# Patient Record
Sex: Male | Born: 1970 | Race: White | Hispanic: No | Marital: Married | Smoking: Never smoker
Health system: Southern US, Community
[De-identification: ages and names within clinical notes are randomized; demographics above are authoritative.]

## PROBLEM LIST (undated history)

## (undated) DIAGNOSIS — N3281 Overactive bladder: Secondary | ICD-10-CM

## (undated) DIAGNOSIS — F988 Other specified behavioral and emotional disorders with onset usually occurring in childhood and adolescence: Secondary | ICD-10-CM

## (undated) DIAGNOSIS — K589 Irritable bowel syndrome without diarrhea: Secondary | ICD-10-CM

## (undated) DIAGNOSIS — T7840XA Allergy, unspecified, initial encounter: Secondary | ICD-10-CM

## (undated) DIAGNOSIS — K649 Unspecified hemorrhoids: Secondary | ICD-10-CM

## (undated) HISTORY — PX: KIDNEY STONE SURGERY: SHX686

## (undated) HISTORY — DX: Allergy, unspecified, initial encounter: T78.40XA

## (undated) HISTORY — PX: WISDOM TOOTH EXTRACTION: SHX21

## (undated) HISTORY — DX: Irritable bowel syndrome, unspecified: K58.9

## (undated) HISTORY — DX: Other specified behavioral and emotional disorders with onset usually occurring in childhood and adolescence: F98.8

## (undated) HISTORY — DX: Unspecified hemorrhoids: K64.9

## (undated) HISTORY — DX: Overactive bladder: N32.81

---

## 2011-08-26 ENCOUNTER — Emergency Department (HOSPITAL_BASED_OUTPATIENT_CLINIC_OR_DEPARTMENT_OTHER): Payer: Managed Care, Other (non HMO)

## 2011-08-26 ENCOUNTER — Encounter (HOSPITAL_BASED_OUTPATIENT_CLINIC_OR_DEPARTMENT_OTHER): Payer: Self-pay | Admitting: *Deleted

## 2011-08-26 ENCOUNTER — Emergency Department (HOSPITAL_BASED_OUTPATIENT_CLINIC_OR_DEPARTMENT_OTHER)
Admission: EM | Admit: 2011-08-26 | Discharge: 2011-08-26 | Disposition: A | Discharge status: Home / Self Care | Payer: Managed Care, Other (non HMO) | Attending: Emergency Medicine | Admitting: Emergency Medicine

## 2011-08-26 DIAGNOSIS — S93609A Unspecified sprain of unspecified foot, initial encounter: Secondary | ICD-10-CM | POA: Insufficient documentation

## 2011-08-26 DIAGNOSIS — X500XXA Overexertion from strenuous movement or load, initial encounter: Secondary | ICD-10-CM | POA: Insufficient documentation

## 2011-08-26 DIAGNOSIS — S93602A Unspecified sprain of left foot, initial encounter: Secondary | ICD-10-CM

## 2011-08-26 DIAGNOSIS — Z880 Allergy status to penicillin: Secondary | ICD-10-CM | POA: Insufficient documentation

## 2011-08-26 DIAGNOSIS — Y9289 Other specified places as the place of occurrence of the external cause: Secondary | ICD-10-CM | POA: Insufficient documentation

## 2011-08-26 MED ORDER — NAPROXEN 500 MG PO TABS: 500.0000 mg | ORAL_TABLET | Freq: Two times a day (BID) | ORAL | Status: DC

## 2011-08-26 NOTE — ED Notes (Signed)
Pt. Reports he has pain in the L foot to the outer side.  NOted edema and bruising.

## 2011-08-26 NOTE — ED Provider Notes (Signed)
History     CSN: [id number]  Arrival date & time 08/26/11  2157   First MD Initiated Contact with Patient 08/26/11 2318      Chief Complaint  Patient presents with  . Foot Injury    per Pt. about 1930 he turned the L foot and now has bruising and edema on the outer side of the L foot.    (Consider location/radiation/quality/duration/timing/severity/associated sxs/prior treatment) HPI Comments: 41 year old male who presents after inverting his left foot while stepping on a curb, felt a pop and then gradual onset of swelling which has been persistent, stable, not associated with difficulty ambulating. No head injury neck pain or other musculoskeletal complaints.  Patient is a 41 y.o. male presenting with foot injury. The history is provided by the patient and a relative.  Foot Injury  Pertinent negatives include no numbness.    History reviewed. No pertinent past medical history.  History reviewed. No pertinent past surgical history.  No family history on file.  History  Substance Use Topics  . Smoking status: Not on file  . Smokeless tobacco: Not on file  . Alcohol Use: Not on file      Review of Systems  HENT: Negative for neck pain.   Gastrointestinal: Negative for nausea and vomiting.  Musculoskeletal: Positive for joint swelling ( Left foot). Negative for back pain.  Neurological: Negative for weakness and numbness.    Allergies  Amoxicillin  Home Medications   Current Outpatient Rx  Name Route Sig Dispense Refill  . TYLENOL COLD PO Oral Take 2 tablets by mouth daily as needed. For cold symptoms.    . SOLIFENACIN SUCCINATE 5 MG PO TABS Oral Take 5 mg by mouth daily.    Marland Kitchen NAPROXEN 500 MG PO TABS Oral Take 1 tablet (500 mg total) by mouth 2 (two) times daily with a meal. 30 tablet 0    BP 136/89  Pulse 75  Temp 98.4 F (36.9 C) (Oral)  Resp 18  Ht 6' (1.829 m)  Wt 150 lb (68.04 kg)  BMI 20.34 kg/m2  SpO2 99%  Physical Exam  Nursing note and vitals  reviewed. Constitutional: He appears well-developed and well-nourished. No distress.  HENT:  Head: Normocephalic and atraumatic.  Eyes: Conjunctivae are normal. No scleral icterus.  Cardiovascular: Normal rate, regular rhythm and intact distal pulses.   Pulmonary/Chest: Effort normal and breath sounds normal.  Musculoskeletal: He exhibits tenderness ( Mild tenderness to palpation and associated with mild bruising and swelling to the left dorsal lateral foot, no pain or tenderness over the ankles, base of the fifth metatarsal or midfoot). He exhibits no edema.  Neurological: He is alert. Coordination normal.       Mild antalgic gait secondary to foot pain  Skin: Skin is warm and dry. He is not diaphoretic.       Mild bruising to dorsal lateral left foot    ED Course  Procedures (including critical care time)  Labs Reviewed - No data to display Dg Foot Complete Left  08/26/2011  *RADIOLOGY REPORT*  Clinical Data: Left foot pain after injury.  LEFT FOOT - COMPLETE 3+ VIEW  Comparison: None.  Findings: The left foot appears intact. No evidence of acute fracture or subluxation.  No focal bone lesions.  Bone matrix and cortex appear intact.  No abnormal radiopaque densities in the soft tissues.  IMPRESSION: No acute bony abnormalities.  Original Report Authenticated By: Marlon Pel, M.D.     1. Sprain of left foot  MDM  Rice therapy, x-rays reviewed showing no signs of acute fracture, patient is well-appearing, ambulatory, has expressed his understanding, Naprosyn for home        Vida Roller, MD 08/26/11 2333

## 2011-08-26 NOTE — ED Notes (Signed)
MD at bedside. 

## 2012-04-14 ENCOUNTER — Telehealth: Payer: Self-pay | Admitting: *Deleted

## 2012-04-14 NOTE — Telephone Encounter (Signed)
PT WANTS TO GET A REF. TO [facility]STR[facility] PHYS.  STOMACH ISSUES AND WANTED TO GET LOOKED INTO FURTHER.

## 2012-04-18 NOTE — Telephone Encounter (Signed)
Pt is scheduled on 04/24/12 to discuss his current GI problems. PG

## 2012-04-26 ENCOUNTER — Encounter: Payer: Self-pay | Admitting: Family Medicine

## 2012-04-26 ENCOUNTER — Ambulatory Visit (INDEPENDENT_AMBULATORY_CARE_PROVIDER_SITE_OTHER): Payer: BC Managed Care – PPO | Admitting: Family Medicine

## 2012-04-26 VITALS — BP 109/75 | HR 76 | Wt 157.0 lb

## 2012-04-26 DIAGNOSIS — K589 Irritable bowel syndrome without diarrhea: Secondary | ICD-10-CM

## 2012-04-26 MED ORDER — COLESEVELAM HCL 3.75 G PO PACK: 1.0000 | PACK | Freq: Every day | ORAL | Status: DC

## 2012-04-26 MED ORDER — DICYCLOMINE HCL 20 MG PO TABS: 20.0000 mg | ORAL_TABLET | Freq: Three times a day (TID) | ORAL | Status: DC | PRN

## 2012-04-26 MED ORDER — ESOMEPRAZOLE MAGNESIUM 40 MG PO CPDR: 40.0000 mg | DELAYED_RELEASE_CAPSULE | Freq: Every day | ORAL | Status: DC

## 2012-04-26 NOTE — Progress Notes (Signed)
Subjective:     Patient ID: Gary Becker, male   DOB: Dec 09, 1970, 42 y.o.   MRN: [id number]  HPI Gary Becker is here today to discuss the irritable bowel problem he has had for many years.  He describes his symptoms as being mild to moderate at times and he feels that he is worsening.  He takes Pepto Bismol and Tums which give him some relief.     Review of Systems  Gastrointestinal: Positive for abdominal pain, diarrhea and abdominal distention.       Objective:   Physical Exam  Constitutional: He appears well-nourished. No distress.  Abdominal: Soft. Bowel sounds are normal. He exhibits no distension and no mass. There is tenderness. There is no rebound and no guarding.       Assessment:     Irritable Bowel Syndrome     Plan:     A)  Eliminate Dairy vs Gluten   B)  Stomach Acid - Increase Tums to 3 per day; Pepcid (20-40 mg)  vs Zantac (150-300 mg) , Nexium (Prilosec/Prevacid)   C)  Bowel Composition - Probiotics (Lemon Ginger Tea, Align)   D)  Bowel Motility - Bentyl prescription was given.   E)  Diarrhea/Cholesterol - Welchol (1 package daily) prescription was given.   **

## 2012-04-26 NOTE — Patient Instructions (Addendum)
1)  IBS  A)  Eliminate Dairy vs Gluten   B)  Stomach Acid - Increase Tums to 3 per day; Pepcid (20-40 mg)  vs Zantac (150-300 mg) , Nexium (Prilosec/Prevacid)   C)  Bowel Composition - Probiotics (Lemon Ginger Tea, Align)   D)  Bowel Motility - Bentyl  E)  Diarrhea/Cholesterol - Welchol (1 package daily)    Diet and Irritable Bowel Syndrome  No cure has been found for irritable bowel syndrome (IBS). Many options are available to treat the symptoms. Your caregiver will give you the best treatments available for your symptoms. He or she will also encourage you to manage stress and to make changes to your diet. You need to work with your caregiver and Registered Dietician to find the best combination of medicine, diet, counseling, and support to control your symptoms. The following are some diet suggestions. FOODS THAT MAKE IBS WORSE  Fatty foods, such as [place] fries.  Milk products, such as cheese or ice cream.  Chocolate.  Alcohol.  Caffeine (found in coffee and some sodas).  Carbonated drinks, such as soda. If certain foods cause symptoms, you should eat less of them or stop eating them. FOOD JOURNAL   Keep a journal of the foods that seem to cause distress. Write down:  What you are eating during the day and when.  What problems you are having after eating.  When the symptoms occur in relation to your meals.  What foods always make you feel badly.  Take your notes with you to your caregiver to see if you should stop eating certain foods. FOODS THAT MAKE IBS BETTER Fiber reduces IBS symptoms, especially constipation, because it makes stools soft, bulky, and easier to pass. Fiber is found in bran, bread, cereal, beans, fruit, and vegetables. Examples of foods with fiber include:  Apples.  Peaches.  Pears.  Berries.  Figs.  Broccoli, raw.  Cabbage.  Carrots.  Raw peas.  Kidney beans.  Lima beans.  Whole-grain bread.  Whole-grain cereal. Add foods  with fiber to your diet a little at a time. This will let your body get used to them. Too much fiber at once might cause gas and swelling of your abdomen. This can trigger symptoms in a person with IBS. Caregivers usually recommend a diet with enough fiber to produce soft, painless bowel movements. High fiber diets may cause gas and bloating. However, these symptoms often go away within a few weeks, as your body adjusts. In many cases, dietary fiber may lessen IBS symptoms, particularly constipation. However, it may not help pain or diarrhea. High fiber diets keep the colon mildly enlarged (distended) with the added fiber. This may help prevent spasms in the colon. Some forms of fiber also keep water in the stool, thereby preventing hard stools that are difficult to pass.  Besides telling you to eat more foods with fiber, your caregiver may also tell you to get more fiber by taking a fiber pill or drinking water mixed with a special high fiber powder. An example of this is a natural fiber laxative containing psyllium seed.  TIPS  Large meals can cause cramping and diarrhea in people with IBS. If this happens to you, try eating 4 or 5 small meals a day, or try eating less at each of your usual 3 meals. It may also help if your meals are low in fat and high in carbohydrates. Examples of carbohydrates are pasta, rice, whole-grain breads and cereals, fruits, and vegetables.  If  dairy products cause your symptoms to flare up, you can try eating less of those foods. You might be able to handle yogurt better than other dairy products, because it contains bacteria that helps with digestion. Dairy products are an important source of calcium and other nutrients. If you need to avoid dairy products, be sure to talk with a Registered Dietitian about getting these nutrients through other food sources.  Drink enough water and fluids to keep your urine clear or pale yellow. This is important, especially if you have  diarrhea. FOR MORE INFORMATION  International Foundation for Functional Gastrointestinal Disorders: www.iffgd.org  National Digestive Diseases Information Clearinghouse: digestive.StageSync.si Document Released: 03/27/2003 Document Revised: 03/29/2011 Document Reviewed: 12/12/2006 [facility] Patient Information 2013 [place], [place]. Gluten-Free Diet Gluten is a protein found in many grains. Gluten is present in wheat, rye, and barley. Gluten from wheat, rye, and barley protein interferes with the absorption of food in people with gluten sensitivity. It may also cause intestinal injury when eaten by individuals with gluten sensitivity.  A sample piece (biopsy) of the small intestine is usually required for a positive diagnosis of gluten sensitivity. Dietary treatment consists of eliminating foods and food ingredients from wheat, rye, and barley. When these are taken out of the diet completely, most people regain function of the small intestine. Strict compliance is important even during symptom-free periods. People with gluten sensitivity need to be on a gluten-free diet for a lifetime. During the first stages of treatment, some people will also need to restrict dairy products that contain lactose, which is a naturally occurring sugar. Lactose is difficult to absorb when the small intestines are damaged (lactose intolerance).  WHO NEEDS THIS DIET Some people who have certain diseases need to be on a gluten-free diet. These diseases include:  Celiac disease.  Nontropical sprue.  Gluten-sensitive enteropathy.  Dermatitis herpetiformis. SPECIAL NOTES  Read all labels because gluten may have been added as an incidental ingredient. Words to check for on the label include: flour, starch, durum flour, graham flour, phosphated flour, self-rising flour, semolina, farina, modified food starch, cereal, thickening, fillers, emulsifiers, any kind of malt flavoring, and hydrolyzed vegetable protein. A  registered dietician can help you identify possible harmful ingredients in the foods you normally eat.  If you are not sure whether an ingredient contains gluten, check with the manufacturer. Note that some manufacturers may change ingredients without notice. Always read labels.  Since flour and cereal products are often used in the preparation of foods, it is important to be aware of the methods of preparation used, as well as the foods themselves. This is especially true when you are dining out. Starches  Allowed: Only those prepared from arrowroot, corn, potato, rice, and bean flours. Rice wafers(*), pure cornmeal tortillas, popcorn, some crackers, and chips(*). Hot cereals made from cornmeal. Ask your dietician which specific hot and cold cereals are allowed. White or sweet potatoes, yams, hominy, rice or wild rice, and special gluten-free pasta. Some oriental rice noodles or bean noodles.  Avoid: All wheat and rye cereals, wheat germ, barley, bran, graham, malt, bulgur, and millet(-). NOTE: Avoid cereals containing malt as a flavoring, such as rice cereal. Regular noodles, spaghetti, macaroni, and most packaged rice mixes(*). All others containing wheat, rye, or barley. Vegetables  Allowed: All plain, fresh, frozen, or canned vegetables.  Avoid: Creamed vegetables(*) and vegetables canned in sauces(*). Any prepared with wheat, rye, or barley. Fruit  Allowed: All fresh, frozen, canned, or dried fruits. Fruit juices.  Avoid: Thickened  or prepared fruits and some pie fillings(*). Meat and Meat Substitutes  Allowed: Meat, fish, poultry, or eggs prepared without added wheat, rye, or barley. Luncheon meat(*), frankfurters(*), and pure meat. All aged cheese and processed cheese products(*). Cottage cheese(+) and cream cheese(+). Dried beans, dried peas, and lentils.  Avoid: Any meat or meat alternate containing wheat, rye, barley, or gluten stabilizers. Bread-containing products, such as Swiss  steak, croquettes, and meatloaf. Tuna canned in vegetable broth(*); [place] with HVP injected as part of the basting; any cheese product containing oat gum as an ingredient. Milk  Allowed: Milk. Yogurt made with allowed ingredients(*).  Avoid: Commercial chocolate milk which may have cereal added(*). Malted milk. Soups and Combination Foods  Allowed: Homemade broth and soups made with allowed ingredients; some canned or frozen soups are allowed(*). Combination or prepared foods that do not contain gluten(*). Read labels.  Avoid: All soups containing wheat, rye, or barley flour. Bouillon and bouillon cubes that contain hydrolyzed vegetable protein (HVP). Combination or prepared foods that contain gluten(*). Desserts  Allowed:  Custard, some pudding mixes(*), homemade puddings from cornstarch, rice, and tapioca. Gelatin desserts, ices, and sherbet(*). Cake, cookies, and other desserts prepared with allowed flours. Some commercial ice creams(*). Ask your dietician about specific brands of dessert that are allowed.  Avoid: Cakes, cookies, doughnuts, and pastries that are prepared with wheat, rye, or barley flour. Some commercial ice creams(*), ice cream flavors which contain cookies, crumbs, or cheesecake(*). Ice cream cones. All commercially prepared mixes for cakes, cookies, and other desserts(*). Bread pudding and other puddings thickened with flour. Sweets  Allowed: Sugar, honey, syrup(*), molasses, jelly, jam, plain hard candy, marshmallows, gumdrops, homemade candies free from wheat, rye, or barley. Coconut.  Avoid: Commercial candies containing wheat, rye, or barley(*). Certain buttercrunch toffees are dusted with wheat flour. Ask your dietician about specific brands that are not allowed. Chocolate-coated nuts, which are often rolled in flour. Fats and Oils  Allowed: Butter, margarine, vegetable oil, sour cream(+), whipping cream, shortening, lard, cream, mayonnaise(*). Some commercial salad  dressings(*). Peanut butter.  Avoid: Some commercial salad dressings(*). Beverages  Allowed: Coffee (regular or decaffeinated), tea, herbal tea (read label to be sure that no wheat flour has been added). Carbonated beverages and some root beers(*). Wine, sake, and distilled spirits, such as gin, vodka, and whiskey.  Avoid:  Certain cereal beverages. Ask your dietician about specific brands that are not allowed. Beer (unless gluten-free), ale, malted milk, and some root beers, wine, and sake. Condiments/ Miscellaneous  Allowed: Salt, pepper, herbs, spices, extracts, and food colorings. Monosodium glutamate (MSG). Cider, rice, and wine vinegar. Baking soda and baking powder. Certain soy sauces. Ask your dietician about specific brands that are allowed. Nuts, coconut, chocolate, and pure cocoa powder.  Avoid: Some curry powder(*), some dry seasoning mixes(*), some gravy extracts(*), some meat sauces(*), some catsup(*), some prepared mustard(*), horseradish(*), some soy sauce(*), chip dips(*), and some chewing gum(*). Yeast extract (contains barley). Caramel color (may contain malt). Ask your dietician about specific brands of condiments to avoid. Flour and Thickening Agents  Allowed: Arrowroot starch (A); Corn bran (B); Corn flour (B,C,D); Corn germ (B); Cornmeal (B,C,D); Corn starch (A); Potato flour (B,C,E); Potato starch flour (B,C,E); Rice bran (B); Rice flours: Plain, brown (B,C,D,E), and Sweet (A,B,C,F). Rice polish (B,C,G); Soy flour (B,C,G); Tapioca starch (A). The flour and thickening agents described above are good for: (A) Good thickening agent (B) Good when combined with other flours (C) Best combined with milk and eggs in baked products (  D) Best in grainy-textured products (E) Produces drier product than other flours (F) Produces moister product than other flours (G) Adds distinct flavor to product. Use in moderation. (*) Check labels and investigate any questionable ingredients.   (-) Additional research is needed before this product can be recommended. (+) Check vegetable gum used. SAMPLE MEAL PLAN Breakfast   Orange juice.  Banana.  Rice or corn cereal.  Toast (gluten-free bread).  Heart-healthy tub margarine.  Jam.  Milk.  Coffee or tea. Lunch  Chicken salad sandwich (with gluten-free bread and mayonnaise).  Sliced tomatoes.  Heart-healthy tub margarine.  Apple.  Milk.  Coffee or tea. Dinner  [place].  Baked potato.  Broccoli.  Lettuce salad with gluten-free dressing.  Gluten-free bread.  Custard.  Heart-healthy margarine.  Coffee or tea. These meal plans are provided as samples. Your daily meal plans will vary. Document Released: 01/04/2005 Document Revised: 07/06/2011 Document Reviewed: 02/14/2011 [facility] Patient Information 2013 [place], [place].

## 2012-04-27 ENCOUNTER — Encounter: Payer: Self-pay | Admitting: Family Medicine

## 2012-04-27 DIAGNOSIS — K589 Irritable bowel syndrome without diarrhea: Secondary | ICD-10-CM | POA: Insufficient documentation

## 2013-07-26 ENCOUNTER — Ambulatory Visit (INDEPENDENT_AMBULATORY_CARE_PROVIDER_SITE_OTHER): Payer: BC Managed Care – PPO | Admitting: Family Medicine

## 2013-07-26 ENCOUNTER — Encounter: Payer: Self-pay | Admitting: Family Medicine

## 2013-07-26 VITALS — BP 128/78 | HR 61 | Resp 16 | Wt 154.0 lb

## 2013-07-26 DIAGNOSIS — K649 Unspecified hemorrhoids: Secondary | ICD-10-CM

## 2013-07-26 DIAGNOSIS — K648 Other hemorrhoids: Secondary | ICD-10-CM

## 2013-07-26 DIAGNOSIS — K589 Irritable bowel syndrome without diarrhea: Secondary | ICD-10-CM

## 2013-07-26 DIAGNOSIS — K625 Hemorrhage of anus and rectum: Secondary | ICD-10-CM

## 2013-07-26 MED ORDER — HYDROCORTISONE 2.5 % RE CREA: 1.0000 "application " | TOPICAL_CREAM | Freq: Two times a day (BID) | RECTAL | Status: DC

## 2013-07-26 NOTE — Patient Instructions (Signed)
1)  Hemorrhoids - OTC (Preparation H - suppositories/Anusol  Suppositories) vs RX (Proctosol) 1-2 times per day for itching.  Try to not sit for long periods of time.  We will refer you to a gastroenterologist to discuss your hemorrhoids and IBS and whether or not you need a colonoscopy.      Anal Pruritus Anal pruritus is an itching of the anus, which is often due to increased moisture of the skin around the anus. Moisture may be due to sweating or a small amount of remaining stool. The itching and scratching can cause further skin damage.  CAUSES   Poor hygiene.  Excessive moisture from sweating or residual stool in the anal area.  Perfumed soaps and sprays and colored toilet paper.  Chemicals in the foods you eat.  Dietary factors such as caffeine, beer, milk products, chocolate, nuts, citrus fruits, tomatoes, spicy seasonings, jalapeno peppers, and salsa.  Hemorrhoids, infections, and other anal diseases.  Excessive washing.  Overuse of laxatives.  Skin disorders (psoriasis, eczema, or seborrhea). HOME CARE INSTRUCTIONS   Practice good hygiene.  Clean the anal area gently with wet toilet paper, baby wipes, or a wet washcloth after every bowel movement and at bedtime. Avoid using soaps on the anal area. Dry the area thoroughly. Pat the area dry with toilet paper or a towel.  Do not scrub the anal area with anything, even toilet paper.  Try not to scratch the itchy area. Scratching produces more damage, which makes the itching worse.  Take sitz baths in warm water for 15 to 20 minutes, 2 to 3 times a day. Pat the area dry with a soft cloth after each bath.  Zinc oxide ointment or a moisture barrier cream can be applied several times daily to protect the skin.  Only take medicines as directed by your caregiver.  Talk to your caregiver about fiber supplements. These are helpful in normalizing the stool if you have frequent loose stools.  Wear cotton underwear and loose  clothing.  Do not use irritants such as bubble baths, scented toilet paper, or genital deodorants. SEEK MEDICAL CARE IF:   Itching does not improve in several days or gets worse.  You have a fever.  There are problems with increased pain, swelling, or redness. MAKE SURE YOU:   Understand these instructions.  Will watch your condition.  Will get help right away if you are not doing well or get worse. Document Released: 07/06/2010 Document Revised: 03/29/2011 Document Reviewed: 07/06/2010 [employer] Patient Information 2015 [place], [place]. This information is not intended to replace advice given to you by your health care provider. Make sure you discuss any questions you have with your health care provider.  Hemorrhoids Hemorrhoids are swollen veins around the rectum or anus. There are two types of hemorrhoids:   Internal hemorrhoids. These occur in the veins just inside the rectum. They may poke through to the outside and become irritated and painful.  External hemorrhoids. These occur in the veins outside the anus and can be felt as a painful swelling or hard lump near the anus. CAUSES  Pregnancy.   Obesity.   Constipation or diarrhea.   Straining to have a bowel movement.   Sitting for long periods on the toilet.  Heavy lifting or other activity that caused you to strain.  Anal intercourse. SYMPTOMS   Pain.   Anal itching or irritation.   Rectal bleeding.   Fecal leakage.   Anal swelling.   One or more lumps around  the anus.  DIAGNOSIS  Your caregiver may be able to diagnose hemorrhoids by visual examination. Other examinations or tests that may be performed include:   Examination of the rectal area with a gloved hand (digital rectal exam).   Examination of anal canal using a small tube (scope).   A Rathert test if you have lost a significant amount of Kiehn.  A test to look inside the colon (sigmoidoscopy or colonoscopy). TREATMENT Most  hemorrhoids can be treated at home. However, if symptoms do not seem to be getting better or if you have a lot of rectal bleeding, your caregiver may perform a procedure to help make the hemorrhoids get smaller or remove them completely. Possible treatments include:   Placing a rubber band at the base of the hemorrhoid to cut off the circulation (rubber band ligation).   Injecting a chemical to shrink the hemorrhoid (sclerotherapy).   Using a tool to burn the hemorrhoid (infrared light therapy).   Surgically removing the hemorrhoid (hemorrhoidectomy).   Stapling the hemorrhoid to block [place] flow to the tissue (hemorrhoid stapling).  HOME CARE INSTRUCTIONS   Eat foods with fiber, such as whole grains, beans, nuts, fruits, and vegetables. Ask your doctor about taking products with added fiber in them (fibersupplements).  Increase fluid intake. Drink enough water and fluids to keep your urine clear or pale yellow.   Exercise regularly.   Go to the bathroom when you have the urge to have a bowel movement. Do not wait.   Avoid straining to have bowel movements.   Keep the anal area dry and clean. Use wet toilet paper or moist towelettes after a bowel movement.   Medicated creams and suppositories may be used or applied as directed.   Only take over-the-counter or prescription medicines as directed by your caregiver.   Take warm sitz baths for 15-20 minutes, 3-4 times a day to ease pain and discomfort.   Place ice packs on the hemorrhoids if they are tender and swollen. Using ice packs between sitz baths may be helpful.   Put ice in a plastic bag.   Place a towel between your skin and the bag.   Leave the ice on for 15-20 minutes, 3-4 times a day.   Do not use a donut-shaped pillow or sit on the toilet for long periods. This increases Geier pooling and pain.  SEEK MEDICAL CARE IF:  You have increasing pain and swelling that is not controlled by treatment or  medicine.  You have uncontrolled bleeding.  You have difficulty or you are unable to have a bowel movement.  You have pain or inflammation outside the area of the hemorrhoids. MAKE SURE YOU:  Understand these instructions.  Will watch your condition.  Will get help right away if you are not doing well or get worse. Document Released: 01/02/2000 Document Revised: 12/22/2011 Document Reviewed: 11/09/2011 [facility] Patient Information 2015 [place], [place]. This information is not intended to replace advice given to you by your health care provider. Make sure you discuss any questions you have with your health care provider.

## 2013-07-26 NOTE — Progress Notes (Signed)
   Subjective:    Patient ID: Gary Becker, male    DOB: [birth year], 43 y.o.   MRN: [id number]  HPI  Gary Becker is here today complaining of hemorrhoids. He started having trouble with them about 3 weeks ago. He has not tried any OTC medication for his symptoms.  He would just like to have this checked out.     Review of Systems  Constitutional: Negative for activity change, appetite change and fatigue.  Cardiovascular: Negative for chest pain, palpitations and leg swelling.  Gastrointestinal: Positive for Basto in stool and rectal pain.       Hemorrhoids   All other systems reviewed and are negative.     Past Medical History  Diagnosis Date  . Allergy   . ADD (attention deficit disorder)   . IBS (irritable bowel syndrome)   . 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 ALS Mother   . Stroke Paternal Grandfather   . Alzheimer's disease Paternal Grandfather      Current Outpatient Prescriptions on File Prior to Visit  Medication Sig Dispense Refill  . solifenacin (VESICARE) 5 MG tablet Take 5 mg by mouth daily.       No current facility-administered medications on file prior to visit.     Allergies  Allergen Reactions  . Amoxicillin Rash     Immunization History  Administered Date(s) Administered  . Tdap 09/09/2006        Objective:   Physical Exam  Constitutional: He appears well-nourished. No distress.  Abdominal: Soft. Bowel sounds are normal. He exhibits no distension  and no mass. There is no rebound and no guarding.  Genitourinary: Rectal exam shows external hemorrhoid and tenderness. Rectal exam shows no mass and anal tone normal.         Assessment & Plan:    Gary Becker was seen today for hemorrhoids.  Diagnoses and associated orders for this visit:  Other hemorrhoids Comments: He was given medications and instructions for hemorrhoids.   - hydrocortisone (PROCTOSOL HC) 2.5 % rectal cream; Place 1 application rectally 2 (two) times daily. - Ambulatory referral to Gastroenterology  Rectal bleeding Comments: Referral was made to [facility].   - Ambulatory referral to Gastroenterology  IBS (irritable bowel syndrome) Comments: Gary Becker has had this problem for years.  He has been using essential oils recently and his symptoms seem to have improved.   - Ambulatory referral to Gastroenterology

## 2013-08-31 ENCOUNTER — Encounter: Payer: Self-pay | Admitting: Internal Medicine

## 2013-09-06 ENCOUNTER — Encounter: Payer: Self-pay | Admitting: Family Medicine

## 2013-09-06 DIAGNOSIS — K625 Hemorrhage of anus and rectum: Secondary | ICD-10-CM | POA: Insufficient documentation

## 2013-09-06 DIAGNOSIS — K589 Irritable bowel syndrome without diarrhea: Secondary | ICD-10-CM | POA: Insufficient documentation

## 2013-09-06 DIAGNOSIS — K648 Other hemorrhoids: Secondary | ICD-10-CM | POA: Insufficient documentation

## 2013-10-10 ENCOUNTER — Encounter: Payer: Self-pay | Admitting: Internal Medicine

## 2013-11-01 ENCOUNTER — Ambulatory Visit: Payer: BC Managed Care – PPO | Admitting: Internal Medicine

## 2013-11-06 IMAGING — CR DG FOOT COMPLETE 3+V*L*
3 series · 3 of 3 positions shown · non-contrast
Comparison: None.

CLINICAL DATA: Left foot pain after injury.

LEFT FOOT - COMPLETE 3+ VIEW

[t foot ap left]
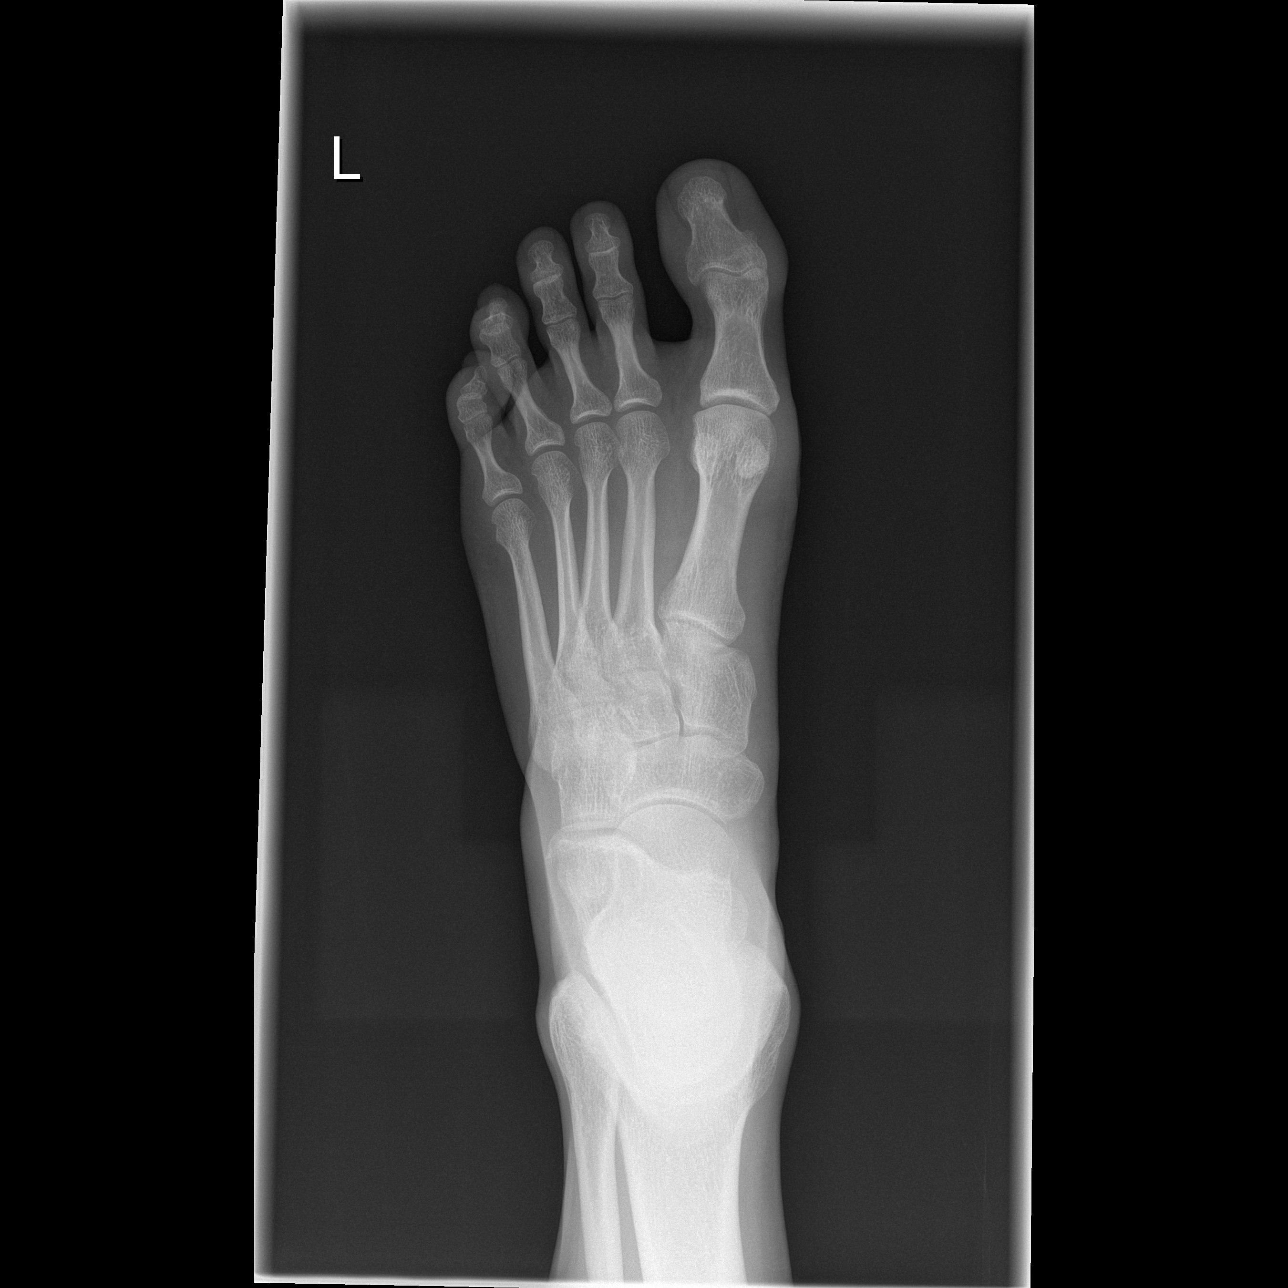

[t foot oblique left]
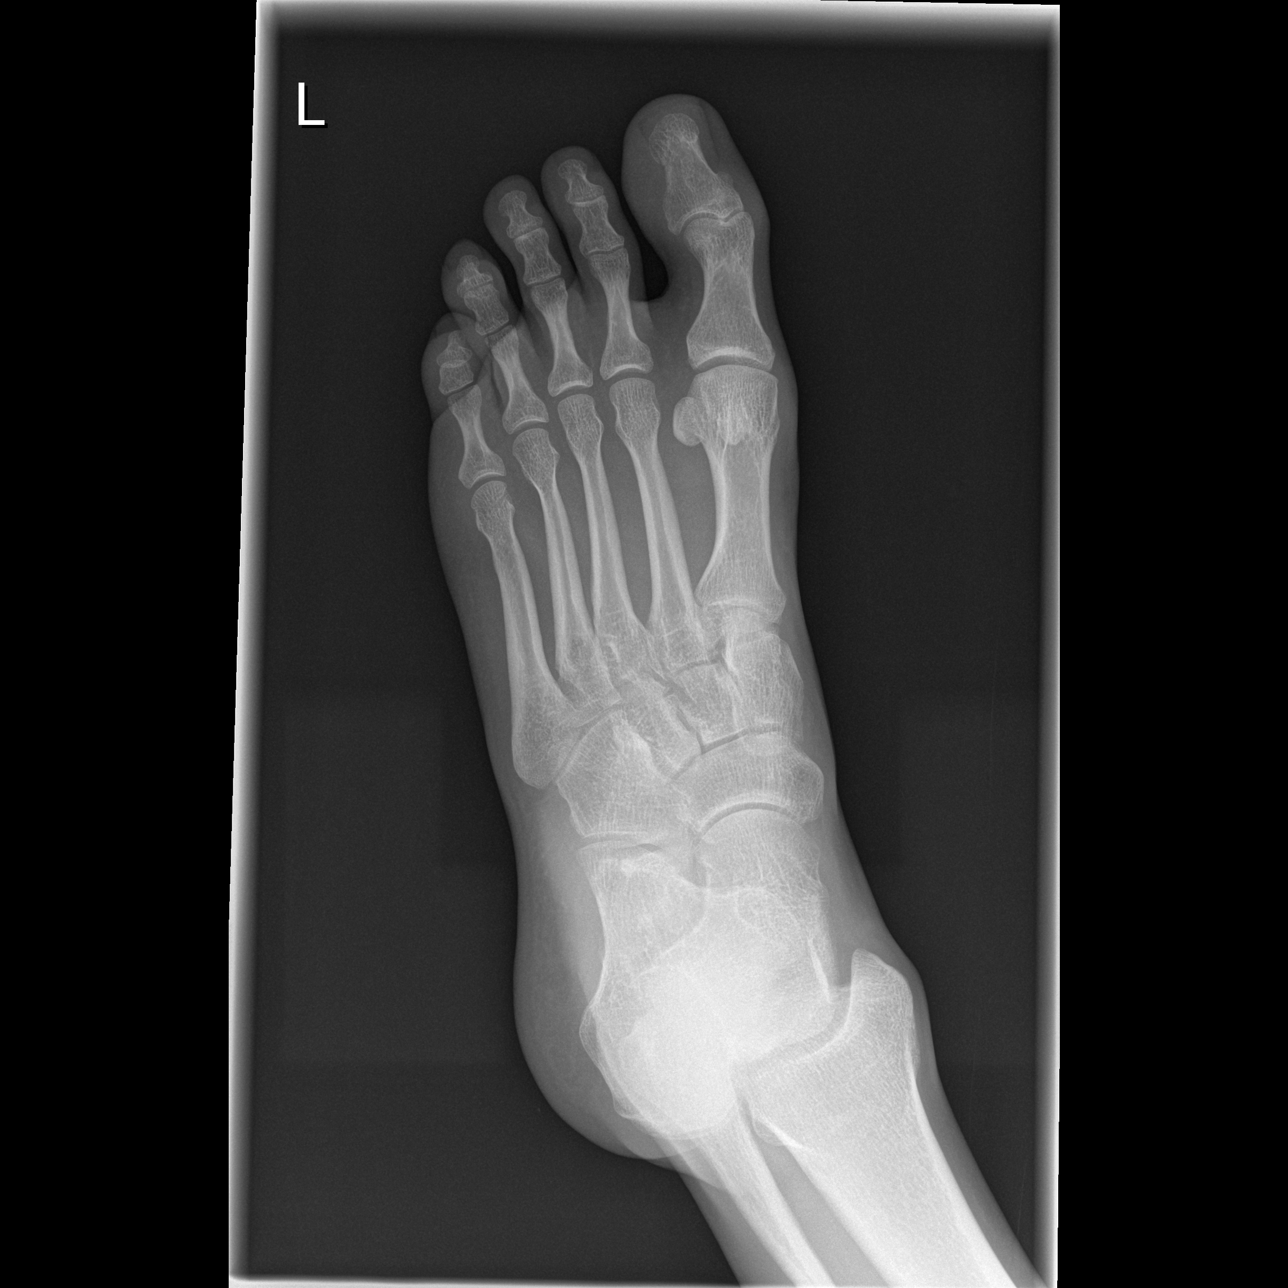

[t foot lat left]
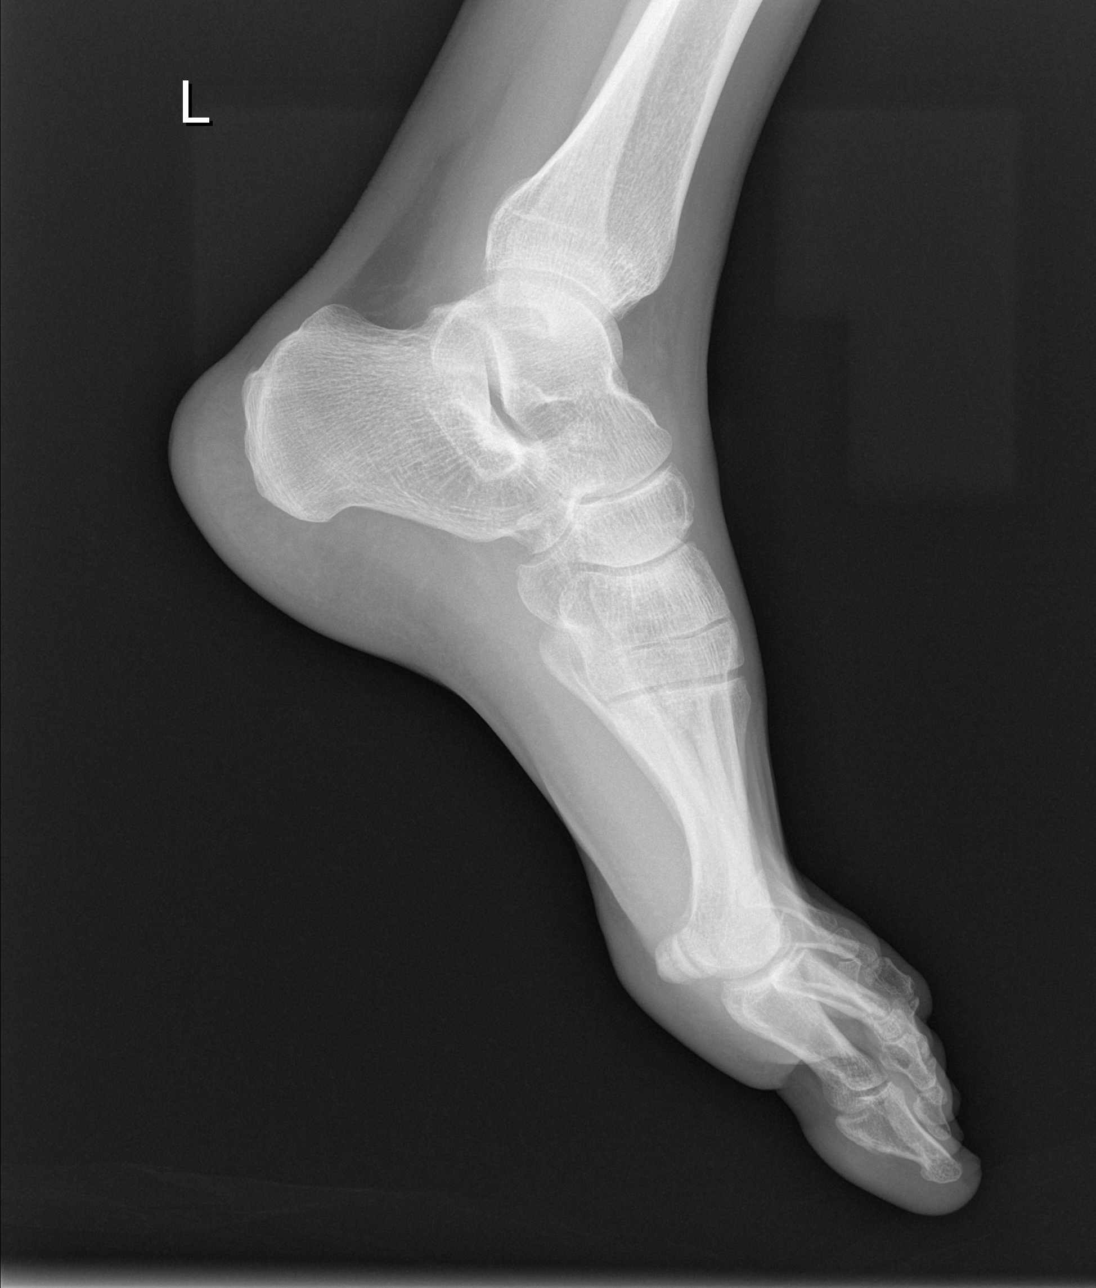

[3 of 3 positions shown; findings below may reference images not displayed]

FINDINGS: The left foot appears intact. No evidence of acute
fracture or subluxation.  No focal bone lesions.  Bone matrix and
cortex appear intact.  No abnormal radiopaque densities in the soft
tissues.
IMPRESSION: No acute bony abnormalities.

## 2013-12-11 ENCOUNTER — Ambulatory Visit (INDEPENDENT_AMBULATORY_CARE_PROVIDER_SITE_OTHER): Payer: BC Managed Care – PPO | Admitting: Internal Medicine

## 2013-12-11 ENCOUNTER — Encounter: Payer: Self-pay | Admitting: Internal Medicine

## 2013-12-11 ENCOUNTER — Other Ambulatory Visit (INDEPENDENT_AMBULATORY_CARE_PROVIDER_SITE_OTHER): Payer: BC Managed Care – PPO

## 2013-12-11 VITALS — BP 110/78 | HR 60 | Ht 72.0 in | Wt 153.4 lb

## 2013-12-11 DIAGNOSIS — R197 Diarrhea, unspecified: Secondary | ICD-10-CM

## 2013-12-11 DIAGNOSIS — R1084 Generalized abdominal pain: Secondary | ICD-10-CM

## 2013-12-11 LAB — IGA: IgA: 158 mg/dL (ref 68–378)

## 2013-12-11 MED ORDER — MOVIPREP 100 G PO SOLR: 1.0000 | Freq: Once | ORAL | Status: DC

## 2013-12-11 NOTE — Progress Notes (Signed)
HISTORY OF PRESENT ILLNESS:  Gary Becker is a 43 y.o. male presents today upon referral from his primary care physician regarding long-standing GI complaints and recently identified hemorrhoid. Patient reports a greater than 20 year history of problems with multiple bowel movements and urgency. Occasional incontinence. Symptoms are exacerbated by meals and stress. Most prominent in the morning and during the week. No nocturnal symptoms. He has used Pepto-Bismol previously. No antidiarrheals. No significant abdominal discomfort or weight loss. Some bloating. Generally 3-4 bowel movements per day, maybe more. He also treats himself with T via and a digestive supplement "digest zen". Will occasionally experience a constipated type bowel. Rare bleeding with multiple bowel movements and occasional area of itching but otherwise no difficulties with hemorrhoids. No longer using cream prescribed. He was prescribed Vesicare for urinary leakage. He feels this is also helped his bowels. His sister apparently has celiac disease. No family history of inflammatory bowel disease.  REVIEW OF SYSTEMS:  All non-GI ROS negative upon extensive complete review  Past Medical History  Diagnosis Date  . Allergy   . ADD (attention deficit disorder)   . IBS (irritable bowel syndrome)   . Overactive bladder   . Hemorrhoids     Past Surgical History  Procedure Laterality Date  . Wisdom tooth extraction    . Kidney stone surgery      Social History Gary Becker  reports that he has never smoked. He has never used smokeless tobacco. He reports that he does not drink alcohol or use illicit drugs.  family history includes ALS in his mother; Alzheimer's disease in his paternal grandfather; Stroke in his paternal grandfather.  Allergies  Allergen Reactions  . Amoxicillin Rash       PHYSICAL EXAMINATION: Vital signs: BP 110/78 mmHg  Pulse 60  Ht 6' (1.829 m)  Wt 153 lb 6.4 oz (69.582 kg)  BMI 20.80 kg/m2   Constitutional: generally well-appearing, no acute distress Psychiatric: alert and oriented x3, cooperative Eyes: extraocular movements intact, anicteric, conjunctiva pink Mouth: oral pharynx moist, no lesions Neck: supple no lymphadenopathy Cardiovascular: heart regular rate and rhythm, no murmur Lungs: clear to auscultation bilaterally Abdomen: soft, nontender, nondistended, no obvious ascites, no peritoneal signs, normal bowel sounds, no organomegaly Rectal: Deferred until colonoscopy (recent rectal exam with external hemorrhoid and tenderness noted from PCP July 2015) Extremities: no lower extremity edema bilaterally Skin: no lesions on visible extremities Neuro: No focal deficits.   ASSESSMENT:  #1. 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 Rule out organic causes. #2. Hemorrhoid. Minimal symptoms  PLAN:  #1. Tissue transglutaminase antibody IgA and serum IgA level to screen for celiac disease #2. Schedule colonoscopy with biopsies to evaluate chronic GI complaints and rule out microscopic colitis or other entities.The nature of the procedure, as well as the risks, benefits, and alternatives were carefully and thoroughly reviewed with the patient. Ample time for discussion and questions allowed. The patient understood, was satisfied, and agreed to proceed. #3. I had a long discussion with him today regarding different diagnostic possibilities. As well we discussed irritable bowel with some detail. All questions answered to his satisfaction at this point.

## 2013-12-11 NOTE — Patient Instructions (Signed)
Your physician has requested that you go to the basement for the following lab work before leaving today:  TTG, IGA  You have been scheduled for a colonoscopy. Please follow written instructions given to you at your visit today.  Please pick up your prep kit at the pharmacy within the next 1-3 days. If you use inhalers (even only as needed), please bring them with you on the day of your procedure. Your physician has requested that you go to www.startemmi.com and enter the access code given to you at your visit today. This web site gives a general overview about your procedure. However, you should still follow specific instructions given to you by our office regarding your preparation for the procedure.   

## 2013-12-12 LAB — TISSUE TRANSGLUTAMINASE, IGA: Tissue Transglutaminase Ab, IgA: <1 U/mL (ref <4)

## 2013-12-27 ENCOUNTER — Encounter: Payer: Self-pay | Admitting: Internal Medicine

## 2014-02-14 ENCOUNTER — Encounter: Payer: BC Managed Care – PPO | Admitting: Internal Medicine

## 2014-02-18 ENCOUNTER — Ambulatory Visit (AMBULATORY_SURGERY_CENTER): Payer: BLUE CROSS/BLUE SHIELD | Admitting: Internal Medicine

## 2014-02-18 ENCOUNTER — Encounter: Payer: Self-pay | Admitting: Internal Medicine

## 2014-02-18 VITALS — BP 115/80 | HR 61 | Temp 96.2°F | Resp 22 | Ht 72.0 in | Wt 153.0 lb

## 2014-02-18 DIAGNOSIS — R197 Diarrhea, unspecified: Secondary | ICD-10-CM

## 2014-02-18 DIAGNOSIS — K589 Irritable bowel syndrome without diarrhea: Secondary | ICD-10-CM

## 2014-02-18 DIAGNOSIS — K625 Hemorrhage of anus and rectum: Secondary | ICD-10-CM

## 2014-02-18 MED ORDER — RIFAXIMIN 550 MG PO TABS: 550.0000 mg | ORAL_TABLET | Freq: Two times a day (BID) | ORAL | Status: DC

## 2014-02-18 MED ORDER — SODIUM CHLORIDE 0.9 % IV SOLN: 500.0000 mL | INTRAVENOUS | Status: DC

## 2014-02-18 NOTE — Patient Instructions (Addendum)

## 2014-02-18 NOTE — Progress Notes (Signed)
Called to room to assist during endoscopic procedure.  Patient ID and intended procedure confirmed with present staff. Received instructions for my participation in the procedure from the performing physician.  

## 2014-02-18 NOTE — Progress Notes (Signed)
Report to PACU, RN, vss, BBS= Clear.  

## 2014-02-18 NOTE — Op Note (Signed)
[facility] [address] [place], [id number]   COLONOSCOPY PROCEDURE REPORT  PATIENT: Gary ClarkBlood, Min  MR#: [id number] BIRTHDATE: Oct 13, 1970 , 43  yrs. old GENDER: male ENDOSCOPIST: Roxy CedarJohn N Skyleen Bentley Jr, MD REFERRED XB:JYNWGBY:Robyn Zanard, M.D. PROCEDURE DATE:  02/18/2014 PROCEDURE:   Colonoscopy with biopsies First Screening Colonoscopy - Avg.  risk and is 50 yrs.  old or older - No.  Prior Negative Screening - Now for repeat screening. N/A  History of Adenoma - Now for follow-up colonoscopy & has been > or = to 3 yrs.  N/A  Polyps Removed Today? No.  Recommend repeat exam, <10 yrs? No. ASA CLASS:   Class I INDICATIONS:chronic diarrhea.. Minor rectal bleeding MEDICATIONS: Monitored anesthesia care and Propofol 300 mg IV  DESCRIPTION OF PROCEDURE:   After the risks benefits and alternatives of the procedure were thoroughly explained, informed consent was obtained.  The digital rectal exam revealed hemorrhoids.   The LB [id number]  endoscope was introduced through the anus and advanced to the cecum, which was identified by both the appendix and ileocecal valve. No adverse events experienced.   The quality of the prep was excellent, using MoviPrep  The instrument was then slowly withdrawn as the colon was fully examined.   COLON FINDINGS: The examined terminal ileum appeared to be normal. A normal appearing cecum, ileocecal valve, and appendiceal orifice were identified.  The ascending, transverse, descending, sigmoid colon, and rectum appeared unremarkable. Random colon biopsies taken to rule out microscopic colitis.  Retroflexed views revealed internal hemorrhoids. The time to cecum=3 minutes 16 seconds. Withdrawal time=8 minutes 14 seconds.  The scope was withdrawn and the procedure completed. COMPLICATIONS: There were no immediate complications.  ENDOSCOPIC IMPRESSION: 1.   The examined terminal ileum appeared normal 2.   Normal  colonoscopy  RECOMMENDATIONS: 1.  Await biopsy results 2.  Continue current colorectal screening recommendations for "routine risk" patients with a repeat colonoscopy in 10 years. 3. Prescribed Xifaxan 550 mg; #28; one by mouth twice a day 4. Office follow-up in 6 weeks  eSigned:  Roxy CedarJohn N Vibha Ferdig Jr, MD 02/18/2014 10:52 AM   cc: The Patient and Birdena JubileeZanard, Robyn MD

## 2014-02-19 ENCOUNTER — Telehealth: Payer: Self-pay

## 2014-02-19 NOTE — Telephone Encounter (Signed)
  Follow up Call-  Call back number 02/18/2014  Post procedure Call Back phone  # [phone]  Permission to leave phone message Yes     Patient questions:  Do you have a fever, pain , or abdominal swelling? No. Pain Score  0 *  Have you tolerated food without any problems? Yes.    Have you been able to return to your normal activities? Yes.    Do you have any questions about your discharge instructions: Diet   No. Medications  No. Follow up visit  No.  Do you have questions or concerns about your Care? No.  Actions: * If pain score is 4 or above: No action needed, pain <4.

## 2014-02-21 ENCOUNTER — Encounter: Payer: Self-pay | Admitting: Internal Medicine

## 2014-04-08 ENCOUNTER — Ambulatory Visit (INDEPENDENT_AMBULATORY_CARE_PROVIDER_SITE_OTHER): Payer: BLUE CROSS/BLUE SHIELD | Admitting: Internal Medicine

## 2014-04-08 ENCOUNTER — Encounter: Payer: Self-pay | Admitting: Internal Medicine

## 2014-04-08 VITALS — BP 100/60 | HR 80 | Ht 72.0 in | Wt 155.0 lb

## 2014-04-08 DIAGNOSIS — R197 Diarrhea, unspecified: Secondary | ICD-10-CM | POA: Diagnosis not present

## 2014-04-08 DIAGNOSIS — K589 Irritable bowel syndrome without diarrhea: Secondary | ICD-10-CM | POA: Diagnosis not present

## 2014-04-08 MED ORDER — RIFAXIMIN 550 MG PO TABS: 550.0000 mg | ORAL_TABLET | Freq: Two times a day (BID) | ORAL | Status: DC

## 2014-04-08 NOTE — Patient Instructions (Signed)
We have sent the following medications to your pharmacy for you to pick up at your convenience:  xifaxan  Please follow up with Dr. Marina GoodellPerry in one year

## 2014-04-08 NOTE — Progress Notes (Signed)
HISTORY OF PRESENT ILLNESS:  Gary ClarkDavid Becker is a 44 y.o. male was initially evaluated 12/11/2013 regarding a 20 year history of bowel complaints manifested by multiple bowel movements and urgency. Symptoms exacerbated by meals and stress. Most prominent in the morning and during the week. No nocturnal symptoms. Testing for celiac disease was negative. He subsequently underwent complete colonoscopy with intubation of the terminal ileum 02/18/2014. Examination was normal. Random colon biopsies were normal. He was treated with rifaximin 550 mg twice a day 2 weeks. He presents today for follow-up. Patient states that while taking rifaximin he had normal bowel movements for the first time in years. Shortly after completion of treatment, symptoms returned. He had no medication side effect. He does request another course of medication.  REVIEW OF SYSTEMS:  All non-GI ROS negative except for urinary leakage  Past Medical History  Diagnosis Date  . Allergy   . ADD (attention deficit disorder)   . IBS (irritable bowel syndrome)   . Overactive bladder   . Hemorrhoids     Past Surgical History  Procedure Laterality Date  . Wisdom tooth extraction    . Kidney stone surgery      Social History Gary Becker  reports that he has never smoked. He has never used smokeless tobacco. He reports that he does not drink alcohol or use illicit drugs.  family history includes ALS in his mother; Alzheimer's disease in his paternal grandfather; Stroke in his paternal grandfather.  Allergies  Allergen Reactions  . Amoxicillin Rash       PHYSICAL EXAMINATION: Vital signs: BP 100/60 mmHg  Pulse 80  Ht 6' (1.829 m)  Wt 155 lb (70.308 kg)  BMI 21.02 kg/m2 General: Well-developed, well-nourished, no acute distress HEENT: Sclerae are anicteric, conjunctiva pink. Oral mucosa intact Lungs: Clear Heart: Regular Abdomen: soft, nontender, nondistended, no obvious ascites, no peritoneal signs, normal bowel sounds.  No organomegaly. Extremities: No edema Psychiatric: alert and oriented x3. Cooperative     ASSESSMENT:  #1. Diarrhea predominant IBS. Responded to rifaximin without durable response   PLAN:  #1. Repeat course of rifaximin 550 mg twice a day 3 weeks. Should this again prove helpful, then refills for on demand treatment. #2. Routine GI follow-up one year #3. Routine repeat screening colonoscopy 10 years

## 2020-03-12 DIAGNOSIS — K648 Other hemorrhoids: Secondary | ICD-10-CM | POA: Diagnosis not present

## 2020-03-12 DIAGNOSIS — K625 Hemorrhage of anus and rectum: Secondary | ICD-10-CM | POA: Diagnosis not present

## 2020-03-12 DIAGNOSIS — K6289 Other specified diseases of anus and rectum: Secondary | ICD-10-CM | POA: Diagnosis not present

## 2020-03-26 DIAGNOSIS — K648 Other hemorrhoids: Secondary | ICD-10-CM | POA: Diagnosis not present

## 2020-09-18 DIAGNOSIS — N3281 Overactive bladder: Secondary | ICD-10-CM | POA: Diagnosis not present

## 2020-09-18 DIAGNOSIS — R350 Frequency of micturition: Secondary | ICD-10-CM | POA: Diagnosis not present

## 2020-09-18 DIAGNOSIS — N529 Male erectile dysfunction, unspecified: Secondary | ICD-10-CM | POA: Diagnosis not present

## 2021-11-06 DIAGNOSIS — Z125 Encounter for screening for malignant neoplasm of prostate: Secondary | ICD-10-CM | POA: Diagnosis not present

## 2021-12-18 DIAGNOSIS — R350 Frequency of micturition: Secondary | ICD-10-CM | POA: Diagnosis not present

## 2021-12-18 DIAGNOSIS — N529 Male erectile dysfunction, unspecified: Secondary | ICD-10-CM | POA: Diagnosis not present

## 2021-12-25 ENCOUNTER — Encounter: Payer: Self-pay | Admitting: Family Medicine

## 2021-12-25 ENCOUNTER — Ambulatory Visit (INDEPENDENT_AMBULATORY_CARE_PROVIDER_SITE_OTHER): Payer: BC Managed Care – PPO | Admitting: Family Medicine

## 2021-12-25 VITALS — BP 122/80 | HR 78 | Temp 97.5°F | Ht 70.5 in | Wt 146.0 lb

## 2021-12-25 DIAGNOSIS — N529 Male erectile dysfunction, unspecified: Secondary | ICD-10-CM

## 2021-12-25 DIAGNOSIS — Z973 Presence of spectacles and contact lenses: Secondary | ICD-10-CM

## 2021-12-25 DIAGNOSIS — M25511 Pain in right shoulder: Secondary | ICD-10-CM

## 2021-12-25 DIAGNOSIS — K589 Irritable bowel syndrome without diarrhea: Secondary | ICD-10-CM

## 2021-12-25 DIAGNOSIS — Z8659 Personal history of other mental and behavioral disorders: Secondary | ICD-10-CM

## 2021-12-25 DIAGNOSIS — K648 Other hemorrhoids: Secondary | ICD-10-CM | POA: Diagnosis not present

## 2021-12-25 DIAGNOSIS — N3281 Overactive bladder: Secondary | ICD-10-CM

## 2021-12-25 NOTE — Progress Notes (Unsigned)
Assessment/Plan:   Problem List Items Addressed This Visit   None      Subjective:  HPI:  Gary Becker is a 51 y.o. male who has Irritable bowel syndrome; Other hemorrhoids; Rectal bleeding; and IBS (irritable bowel syndrome) on their problem list..   He  has a past medical history of ADD (attention deficit disorder), Allergy, Hemorrhoids, IBS (irritable bowel syndrome), and Overactive bladder.Marland Kitchen   He presents with chief complaint of Establish Care (Currently has right shoulder pain occasionally. ) .   Works at [employer].   Hemorrhiods, not itching, red with wipes   HLD.   H/o irregular thyroid, has had checked into   IBS - D, tried low FODMAP, improved with hyoscamine, improved with    H/o ADD, did not tolerate meds  MBA from [employer]   Shoulder Pain: Patient complaints of right shoulder pain. This is evaluated as a personal injury. The pain is described as sharp.  The onset of the pain was {month:10108} {date 1-31:31396}, {year history:31397}.  The pain occurs {Pain frequency pattern:15955} and lasts {Numbers; 0-30:31392} {unit:11}.  Location is {lat,ant,global:15990}. {no hx/recur/limits:15993} of dislocation. Symptoms are aggravated by {causes; pain shoulder:16018}. Symptoms are diminished by  {Pain upper relieved by:16019}.   Limited activities include: {causes; pain shoulder:16018}. {Shoulder stiffness,weakness,swelling&crepitu:15877} is reported. Patient is a {type:15956} and he {has/not missed work:16023}.      Past Surgical History:  Procedure Laterality Date   KIDNEY STONE SURGERY     WISDOM TOOTH EXTRACTION      Outpatient Medications Prior to Visit  Medication Sig Dispense Refill   MYRBETRIQ 50 MG TB24 tablet Take 50 mg by mouth daily.     sildenafil (REVATIO) 20 MG tablet      rifaximin (XIFAXAN) 550 MG TABS tablet Take 1 tablet (550 mg total) by mouth 2 (two) times daily. (Patient not taking: Reported on 12/25/2021) 42 tablet 4   VESICARE 10 MG tablet  (Patient  not taking: Reported on 12/25/2021)     No facility-administered medications prior to visit.    Family History  Problem Relation Age of Onset   ALS Mother    Depression Mother    Stroke Paternal Grandfather    Alzheimer's disease Paternal Grandfather    Heart disease Paternal Grandfather    ADD / ADHD Sister     Social History   Socioeconomic History   Marital status: Married    Spouse name: Okey Regal    Number of children: 3   Years of education: 16   Highest education level: Not on file  Occupational History   Occupation: Garment/textile technologist: Audiological scientist [employer]  Tobacco Use   Smoking status: Never    Passive exposure: Never   Smokeless tobacco: Never  Vaping Use   Vaping Use: Never used  Substance and Sexual Activity   Alcohol use: No   Drug use: No   Sexual activity: Yes    Partners: Female    Birth control/protection: Surgical, None    Comment: Vasectomy  Other Topics Concern   Not on file  Social History Narrative   Marital Status: Married Environmental education officer)   Children:  Sons ([place] and Clifton Custard) Daughter Benetta Spar)    Pets:  Cats (2); Dogs (2)    Living Situation: Lives with spouse and children   Occupation: Art gallery manager (Network engineer)   Education: Engineer, maintenance (IT) ([employer])    Tobacco Use/Exposure:  None    Alcohol Use:  None   Drug Use:  None  Diet:  Regular   Exercise:  None   Hobbies: Playing with kids, yard work               International aid/development worker of Corporate investment banker Strain: Not on file  Food Insecurity: Not on file  Transportation Needs: Not on file  Physical Activity: Not on file  Stress: Not on file  Social Connections: Not on file  Intimate Partner Violence: Not on file                                                                                                 Objective:  Physical Exam: BP 122/80 (BP Location: Left Arm, Patient Position: Sitting, Cuff Size: Large)   Pulse 78   Temp (!) 97.5 F (36.4 C) (Temporal)   Ht 5' 10.5"  (1.791 m)   Wt 146 lb (66.2 kg)   SpO2 98%   BMI 20.65 kg/m    ***General: No acute distress. Awake and conversant.  Eyes: Normal conjunctiva, anicteric. Round symmetric pupils.  ENT: Hearing grossly intact. No nasal discharge.  Neck: Neck is supple. No masses or thyromegaly.  Respiratory: Respirations are non-labored. No auditory wheezing.  Skin: Warm. No rashes or ulcers.  Psych: Alert and oriented. Cooperative, Appropriate mood and affect, Normal judgment.  CV: No cyanosis or JVD MSK: Normal ambulation. No clubbing  Neuro: Sensation and CN II-XII grossly normal.        Garner Nash, MD, MS

## 2022-03-24 DIAGNOSIS — K580 Irritable bowel syndrome with diarrhea: Secondary | ICD-10-CM | POA: Diagnosis not present

## 2022-03-24 DIAGNOSIS — K648 Other hemorrhoids: Secondary | ICD-10-CM | POA: Diagnosis not present

## 2022-08-16 DIAGNOSIS — Z9889 Other specified postprocedural states: Secondary | ICD-10-CM | POA: Diagnosis not present

## 2022-08-16 DIAGNOSIS — Z1211 Encounter for screening for malignant neoplasm of colon: Secondary | ICD-10-CM | POA: Diagnosis not present

## 2022-08-16 DIAGNOSIS — K648 Other hemorrhoids: Secondary | ICD-10-CM | POA: Diagnosis not present

## 2023-01-07 DIAGNOSIS — R350 Frequency of micturition: Secondary | ICD-10-CM | POA: Diagnosis not present

## 2023-01-07 DIAGNOSIS — N529 Male erectile dysfunction, unspecified: Secondary | ICD-10-CM | POA: Diagnosis not present

## 2023-01-21 ENCOUNTER — Encounter: Payer: BC Managed Care – PPO | Admitting: Family Medicine

## 2023-02-23 ENCOUNTER — Encounter: Payer: BC Managed Care – PPO | Admitting: Family Medicine

## 2023-02-25 ENCOUNTER — Other Ambulatory Visit: Payer: BC Managed Care – PPO

## 2023-03-10 ENCOUNTER — Encounter: Payer: BC Managed Care – PPO | Admitting: Family Medicine

## 2023-04-04 ENCOUNTER — Ambulatory Visit (INDEPENDENT_AMBULATORY_CARE_PROVIDER_SITE_OTHER): Payer: BC Managed Care – PPO | Admitting: Family Medicine

## 2023-04-04 ENCOUNTER — Encounter: Payer: Self-pay | Admitting: Family Medicine

## 2023-04-04 VITALS — BP 124/72 | HR 74 | Temp 97.6°F | Ht 70.5 in | Wt 150.2 lb

## 2023-04-04 DIAGNOSIS — Z0000 Encounter for general adult medical examination without abnormal findings: Secondary | ICD-10-CM | POA: Diagnosis not present

## 2023-04-04 DIAGNOSIS — M25511 Pain in right shoulder: Secondary | ICD-10-CM

## 2023-04-04 DIAGNOSIS — K582 Mixed irritable bowel syndrome: Secondary | ICD-10-CM

## 2023-04-04 DIAGNOSIS — E782 Mixed hyperlipidemia: Secondary | ICD-10-CM

## 2023-04-04 DIAGNOSIS — N529 Male erectile dysfunction, unspecified: Secondary | ICD-10-CM | POA: Insufficient documentation

## 2023-04-04 DIAGNOSIS — E559 Vitamin D deficiency, unspecified: Secondary | ICD-10-CM | POA: Insufficient documentation

## 2023-04-04 DIAGNOSIS — Z973 Presence of spectacles and contact lenses: Secondary | ICD-10-CM | POA: Diagnosis not present

## 2023-04-04 DIAGNOSIS — Z1159 Encounter for screening for other viral diseases: Secondary | ICD-10-CM

## 2023-04-04 DIAGNOSIS — R350 Frequency of micturition: Secondary | ICD-10-CM | POA: Diagnosis not present

## 2023-04-04 DIAGNOSIS — G8929 Other chronic pain: Secondary | ICD-10-CM | POA: Insufficient documentation

## 2023-04-04 HISTORY — DX: Mixed hyperlipidemia: E78.2

## 2023-04-04 LAB — CBC WITH DIFFERENTIAL/PLATELET
Basophils Absolute: 0 10*3/uL (ref 0.0–0.1)
Basophils Relative: 1 % (ref 0.0–3.0)
Eosinophils Absolute: 0.2 10*3/uL (ref 0.0–0.7)
Eosinophils Relative: 5.1 % — ABNORMAL HIGH (ref 0.0–5.0)
HCT: 40.8 % (ref 39.0–52.0)
Hemoglobin: 13.7 g/dL (ref 13.0–17.0)
Lymphocytes Relative: 35.2 % (ref 12.0–46.0)
Lymphs Abs: 1.4 10*3/uL (ref 0.7–4.0)
MCHC: 33.5 g/dL (ref 30.0–36.0)
MCV: 88.3 fl (ref 78.0–100.0)
Monocytes Absolute: 0.4 10*3/uL (ref 0.1–1.0)
Monocytes Relative: 9.3 % (ref 3.0–12.0)
Neutro Abs: 1.9 10*3/uL (ref 1.4–7.7)
Neutrophils Relative %: 49.4 % (ref 43.0–77.0)
Platelets: 220 10*3/uL (ref 150.0–400.0)
RBC: 4.62 Mil/uL (ref 4.22–5.81)
RDW: 13 % (ref 11.5–15.5)
WBC: 3.9 10*3/uL — ABNORMAL LOW (ref 4.0–10.5)

## 2023-04-04 LAB — LIPID PANEL
Cholesterol: 189 mg/dL (ref 0–200)
HDL: 58.8 mg/dL (ref >39.00)
LDL Cholesterol: 116 mg/dL — ABNORMAL HIGH (ref 0–99)
NonHDL: 130.67
Total CHOL/HDL Ratio: 3
Triglycerides: 73 mg/dL (ref 0.0–149.0)
VLDL: 14.6 mg/dL (ref 0.0–40.0)

## 2023-04-04 LAB — URINALYSIS, ROUTINE W REFLEX MICROSCOPIC
Bilirubin Urine: NEGATIVE
Ketones, ur: NEGATIVE
Leukocytes,Ua: NEGATIVE
Nitrite: NEGATIVE
Specific Gravity, Urine: 1.025 (ref 1.000–1.030)
Total Protein, Urine: NEGATIVE
Urine Glucose: NEGATIVE
Urobilinogen, UA: 0.2 (ref 0.0–1.0)
pH: 6.5 (ref 5.0–8.0)

## 2023-04-04 LAB — COMPREHENSIVE METABOLIC PANEL
ALT: 14 U/L (ref 0–53)
AST: 18 U/L (ref 0–37)
Albumin: 4.6 g/dL (ref 3.5–5.2)
Alkaline Phosphatase: 100 U/L (ref 39–117)
BUN: 13 mg/dL (ref 6–23)
CO2: 30 meq/L (ref 19–32)
Calcium: 9.2 mg/dL (ref 8.4–10.5)
Chloride: 103 meq/L (ref 96–112)
Creatinine, Ser: 0.98 mg/dL (ref 0.40–1.50)
GFR: 88.92 mL/min (ref >60.00)
Glucose, Bld: 89 mg/dL (ref 70–99)
Potassium: 4 meq/L (ref 3.5–5.1)
Sodium: 141 meq/L (ref 135–145)
Total Bilirubin: 0.9 mg/dL (ref 0.2–1.2)
Total Protein: 6.4 g/dL (ref 6.0–8.3)

## 2023-04-04 LAB — HEMOGLOBIN A1C: Hgb A1c MFr Bld: 5.6 % (ref 4.6–6.5)

## 2023-04-04 LAB — TSH: TSH: 2.92 u[IU]/mL (ref 0.35–5.50)

## 2023-04-04 LAB — MICROALBUMIN / CREATININE URINE RATIO
Creatinine,U: 168.7 mg/dL
Microalb Creat Ratio: 10.9 mg/g (ref 0.0–30.0)
Microalb, Ur: 1.8 mg/dL (ref 0.0–1.9)

## 2023-04-04 MED ORDER — HYOSCYAMINE SULFATE 0.125 MG PO TBDP: 0.1250 mg | ORAL_TABLET | ORAL | 0 refills | Status: DC | PRN

## 2023-04-04 NOTE — Patient Instructions (Addendum)
 VISIT SUMMARY:  Today, you were seen for your annual physical exam and to address your right shoulder pain. We discussed your ongoing shoulder discomfort, your history of irritable bowel syndrome (IBS), and reviewed your general health maintenance.  YOUR PLAN:  -RIGHT SHOULDER PAIN: You have been experiencing chronic right shoulder pain, especially during overhead activities. This may be related to a rotator cuff issue. We will start with an x-ray to rule out any major problems. If the x-ray does not provide clear answers, we may consider physical therapy or refer you to an orthopedic specialist for further imaging.  For  xray, go to:    [place] [facility] [address], [place], [place] [address] Phone: [phone]   -IRRITABLE BOWEL SYNDROME (IBS): Your IBS symptoms, which include abdominal cramping and urgency, are often triggered by anxiety and travel. We will prescribe hyoscyamine 0.125 mg orally disintegrating tablets (ODT) to use as needed, as you have found this medication helpful in the past.  -GENERAL HEALTH MAINTENANCE: We conducted a routine physical examination and discussed your active lifestyle. We will order baseline lab work to check your cholesterol, vitamin D, [place] sugar, [place] counts, electrolytes, and kidney function. Additionally, we will perform a urinalysis and screen for hepatitis C and HIV as part of your general health maintenance.  INSTRUCTIONS:  Please get a right shoulder x-ray at the [facility] facility. If the x-ray results are inconclusive or if your symptoms persist, we may consider physical therapy or a referral to an orthopedic specialist. Additionally, please have the prescribed lab work done, including cholesterol, vitamin D, [place] sugar, [place] counts, electrolytes, kidney function, urinalysis, and screenings for hepatitis C and HIV. Use the hyoscyamine 0.125 mg ODT as needed for your IBS symptoms.

## 2023-04-04 NOTE — Progress Notes (Signed)
 Assessment  Assessment/Plan:   Right Shoulder Pain Chronic right shoulder pain for several years, exacerbated by overhead activities and lifting. Pain is mild at rest (2/10) but can increase to moderate (0/86) with certain activities. No prior evaluation or imaging. No significant tenderness or discomfort on physical exam. Possible rotator cuff issue considered. X-ray is a cost-effective initial step to rule out major issues. If inconclusive, physical therapy or referral to orthopedics for advanced imaging may be considered. - Order right shoulder x-ray at [facility] - Consider physical therapy if x-ray is inconclusive or symptoms persist - Discuss potential referral to orthopedics if advanced imaging is needed  Irritable Bowel Syndrome (IBS) IBS with symptoms exacerbated by anxiety and travel. Symptoms include abdominal cramping, diarrhea, and urgency. Managed with dietary modifications and Pepto Bismol as needed. Previous use of hyoscyamine was beneficial. Hyoscyamine ODT is preferred for its convenience and effectiveness in managing symptoms during travel. - Prescribe hyoscyamine 0.125 mg ODT for use as needed  General Health Maintenance Routine physical examination. Active lifestyle with no major medical issues. Previous lab work indicated elevated LDL cholesterol and low vitamin D. Regular follow-up with eye doctor and dentist. Screening for hepatitis C and HIV is recommended as a one-time test for all adults. - Order baseline lab work including cholesterol, vitamin D, Haughey sugar, Coffelt counts, electrolytes, and kidney function - Perform urinalysis to check for microalbumin - Screen for hepatitis C and HIV     Medications Discontinued During This Encounter  Medication Reason   sildenafil (REVATIO) 20 MG tablet     Patient Counseling(The following topics were reviewed and/or handout was given):  -Nutrition: Stressed importance of moderation in sodium/caffeine intake,  saturated fat and cholesterol, caloric balance, sufficient intake of fresh fruits, vegetables, and fiber.  -Stressed the importance of regular exercise.   -Substance Abuse: Discussed cessation/primary prevention of tobacco, alcohol, or other drug use; driving or other dangerous activities under the influence; availability of treatment for abuse.   -Injury prevention: Discussed safety belts, safety helmets, smoke detector, smoking near bedding or upholstery.   -Sexuality: Discussed sexually transmitted diseases, partner selection, use of condoms, avoidance of unintended pregnancy and contraceptive alternatives.   -Dental health: Discussed importance of regular tooth brushing, flossing, and dental visits.  -Health maintenance and immunizations reviewed. Please refer to Health maintenance section.         Subjective:  Chief complaint Encounter date: 04/04/2023  Chief Complaint  Patient presents with   Annual Exam    Fasting . Right shoulder pain after lifting and throwing over hand x years.   Gary Becker is a 53 y.o. male who presents today for his annual comprehensive physical exam.    Discussed the use of AI scribe software for clinical note transcription with the patient, who gave verbal consent to proceed.  History of Present Illness   Gary Becker is a 52 year old male who presents with right shoulder pain and for an annual physical exam.  He has been experiencing right shoulder pain for several years, localized at the top of the right shoulder joint. The pain is exacerbated by overhead activities such as lifting or throwing. At rest, the pain is mild, rated as 2 out of 10, but it can increase to 5 out of 10 during flare-ups. He manages the discomfort with massage and rest, and does not typically use heat, ice, or analgesics like Tylenol or ibuprofen.  He has a history of urological issues and is currently taking tadalafil daily,  which he finds more tolerable than sildenafil. He  reports no urinary symptoms such as overactive bladder or changes in urine stream. His last urology visit was a year ago, during which he had a PSA test.  He also has a history of irritable bowel syndrome (IBS), which is exacerbated by anxiety and travel. Symptoms include abdominal cramping and urgency of bowel movements. He avoids dairy and has tried a low FODMAP diet without significant improvement. He manages symptoms with Pepto Bismol as needed and has previously used hyoscyamine sublingual tablets, which he found helpful.  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 He feels tired by 9 PM but attributes this to his busy schedule rather than a medical issue. He recently ran a 5K while traveling, indicating a good level of fitness. No chest pain, shortness of breath, or abdominal discomfort.            04/04/2023   10:07 AM 12/25/2021    3:10 PM  Depression screen PHQ 2/9  Decreased Interest 0 0  Down, Depressed, Hopeless 0 0  PHQ - 2 Score 0 0  Altered sleeping 0 0  Tired, decreased energy 1 1  Change in appetite 0 0  Feeling bad or failure about yourself  0 0  Trouble concentrating 0 1  Moving slowly or fidgety/restless 0 0  Suicidal thoughts 0 0  PHQ-9 Score 1 2  Difficult doing work/chores Somewhat difficult Somewhat difficult       04/04/2023   10:07 AM 12/25/2021    3:10 PM  GAD 7 : Generalized Anxiety Score  Nervous, Anxious, on Edge 1 0  Control/stop worrying 0 0  Worry too much - different things 1 0  Trouble relaxing 1 1  Restless 1 0  Easily annoyed or irritable 1 0  Afraid - awful might happen 0 0  Total GAD 7 Score 5 1  Anxiety Difficulty Not difficult at all Not difficult at all    Health Maintenance Due  Topic Date Due   HIV Screening  Never done   Hepatitis C Screening  Never done    PMH:  The following were reviewed and entered/updated in epic: Past Medical History:   Diagnosis Date   ADD (attention deficit disorder)    Allergy    Hemorrhoids    IBS (irritable bowel syndrome)    Moderate mixed hyperlipidemia not requiring statin therapy 04/04/2023   Overactive bladder     Patient Active Problem List   Diagnosis Date Noted   Frequency of micturition 04/04/2023   Chronic right shoulder pain 04/04/2023   Moderate mixed hyperlipidemia not requiring statin therapy 04/04/2023   Vitamin D deficiency 04/04/2023   Wears contact lenses 04/04/2023   Erectile dysfunction 04/04/2023   Other hemorrhoids 09/06/2013   Rectal bleeding 09/06/2013   IBS (irritable bowel syndrome) 09/06/2013   Irritable bowel syndrome 04/27/2012    Past Surgical History:  Procedure Laterality Date   KIDNEY STONE SURGERY     WISDOM TOOTH EXTRACTION      Family History  Problem Relation Age of Onset   ALS Mother    Depression Mother    Stroke Paternal Grandfather    Alzheimer's disease Paternal Grandfather    Heart disease Paternal Grandfather    ADD / ADHD Sister     Medications- reviewed and updated Outpatient Medications Prior to Visit  Medication Sig Dispense Refill   MYRBETRIQ 50 MG TB24  tablet Take 50 mg by mouth daily.     tadalafil (CIALIS) 5 MG tablet Take 5 mg by mouth daily.     sildenafil (REVATIO) 20 MG tablet  (Patient not taking: Reported on 04/04/2023)     No facility-administered medications prior to visit.    Allergies  Allergen Reactions   Amoxicillin Rash   Penicillins Rash    Social History   Socioeconomic History   Marital status: Married    Spouse name: Okey Regal    Number of children: 3   Years of education: 16   Highest education level: Not on file  Occupational History   Occupation: Garment/textile technologist: Audiological scientist [employer]  Tobacco Use   Smoking status: Never    Passive exposure: Never   Smokeless tobacco: Never  Vaping Use   Vaping status: Never Used  Substance and Sexual Activity   Alcohol use: No   Drug use: No   Sexual  activity: Yes    Partners: Female    Birth control/protection: Surgical, None    Comment: Vasectomy  Other Topics Concern   Not on file  Social History Narrative   Marital Status: Married Environmental education officer)   Children:  Sons Geophysicist/field seismologist and Clifton Custard) Daughter Benetta Spar)    Pets:  Cats (2); Dogs (2)    Living Situation: Lives with spouse and children   Occupation: Art gallery manager (Network engineer)   Education: Engineer, maintenance (IT) ([employer])    Tobacco Use/Exposure:  None    Alcohol Use:  None   Drug Use:  None   Diet:  Regular   Exercise:  None   Hobbies: Playing with kids, yard work               Social Drivers of Corporate investment banker Strain: Not on Ship broker Insecurity: Not on file  Transportation Needs: Not on file  Physical Activity: Not on file  Stress: Not on file  Social Connections: Not on file           Objective:  Physical Exam: BP 124/72   Pulse 74   Temp 97.6 F (36.4 C) (Temporal)   Ht 5' 10.5" (1.791 m)   Wt 150 lb 3.2 oz (68.1 kg)   SpO2 98%   BMI 21.25 kg/m   Body mass index is 21.25 kg/m. Wt Readings from Last 3 Encounters:  04/04/23 150 lb 3.2 oz (68.1 kg)  12/25/21 146 lb (66.2 kg)  04/08/14 155 lb (70.3 kg)    Physical Exam Constitutional:      General: He is not in acute distress.    Appearance: Normal appearance. He is not ill-appearing or toxic-appearing.  HENT:     Head: Normocephalic and atraumatic.     Right Ear: Hearing, tympanic membrane, ear canal and external ear normal. There is no impacted cerumen.     Left Ear: Hearing, tympanic membrane, ear canal and external ear normal. There is no impacted cerumen.     Nose: Nose normal. No congestion.     Mouth/Throat:     Lips: No lesions.     Mouth: Mucous membranes are moist.     Pharynx: Oropharynx is clear. No oropharyngeal exudate.  Eyes:     General: No scleral icterus.       Right eye: No discharge.        Left eye: No discharge.     Conjunctiva/sclera: Conjunctivae normal.      Pupils: Pupils are equal, round, and reactive to  light.  Neck:     Thyroid: No thyroid mass, thyromegaly or thyroid tenderness.  Cardiovascular:     Rate and Rhythm: Normal rate and regular rhythm.     Pulses: Normal pulses.     Heart sounds: Normal heart sounds.  Pulmonary:     Effort: Pulmonary effort is normal. No respiratory distress.     Breath sounds: Normal breath sounds.  Abdominal:     General: Abdomen is flat. Bowel sounds are normal.     Palpations: Abdomen is soft.  Musculoskeletal:        General: Normal range of motion.     Right shoulder: Tenderness present. No swelling, deformity, effusion, laceration, bony tenderness or crepitus. Normal range of motion. Normal strength. Normal pulse.       Arms:     Cervical back: Normal range of motion.     Right lower leg: No edema.     Left lower leg: No edema.  Lymphadenopathy:     Cervical: No cervical adenopathy.  Skin:    General: Skin is warm and dry.     Findings: No rash.  Neurological:     General: No focal deficit present.     Mental Status: He is alert and oriented to person, place, and time. Mental status is at baseline.     Deep Tendon Reflexes:     Reflex Scores:      Patellar reflexes are 2+ on the right side and 2+ on the left side. Psychiatric:        Mood and Affect: Mood normal.        Behavior: Behavior normal.        Thought Content: Thought content normal.        Judgment: Judgment normal.         At today's visit, we discussed treatment options, associated risk and benefits, and engage in counseling as needed.  Additionally the following were reviewed: Past medical records, past medical and surgical history, family and social background, as well as relevant laboratory results, imaging findings, and specialty notes, where applicable.  This message was generated using dictation software, and as a result, it may contain unintentional typos or errors.  Nevertheless, extensive effort was made to  accurately convey at the pertinent aspects of the patient visit.    There may have been are other unrelated non-urgent complaints, but due to the busy schedule and the amount of time already spent with him, time does not permit to address these issues at today's visit. Another appointment may have or has been requested to review these additional issues.     Thomes Dinning, MD, MS

## 2023-04-08 LAB — VITAMIN D 1,25 DIHYDROXY
Vitamin D 1, 25 (OH)2 Total: 51 pg/mL (ref 18–72)
Vitamin D2 1, 25 (OH)2: <8 pg/mL
Vitamin D3 1, 25 (OH)2: 51 pg/mL

## 2023-04-08 LAB — HIV ANTIBODY (ROUTINE TESTING W REFLEX): HIV 1&2 Ab, 4th Generation: NONREACTIVE

## 2023-04-08 LAB — HEPATITIS C ANTIBODY: Hepatitis C Ab: NONREACTIVE

## 2023-04-11 ENCOUNTER — Other Ambulatory Visit: Payer: Self-pay | Admitting: Family Medicine

## 2023-04-11 ENCOUNTER — Encounter: Payer: Self-pay | Admitting: Family Medicine

## 2023-04-11 DIAGNOSIS — R3121 Asymptomatic microscopic hematuria: Secondary | ICD-10-CM | POA: Insufficient documentation

## 2023-11-22 ENCOUNTER — Ambulatory Visit: Admitting: Internal Medicine

## 2023-11-22 ENCOUNTER — Encounter: Payer: Self-pay | Admitting: Internal Medicine

## 2023-11-22 VITALS — BP 124/82 | HR 68 | Temp 98.7°F | Ht 71.0 in | Wt 152.4 lb

## 2023-11-22 DIAGNOSIS — K582 Mixed irritable bowel syndrome: Secondary | ICD-10-CM

## 2023-11-22 DIAGNOSIS — H66001 Acute suppurative otitis media without spontaneous rupture of ear drum, right ear: Secondary | ICD-10-CM

## 2023-11-22 MED ORDER — HYOSCYAMINE SULFATE 0.125 MG PO TBDP: 0.1250 mg | ORAL_TABLET | ORAL | 0 refills | Status: AC | PRN

## 2023-11-22 MED ORDER — AZITHROMYCIN 250 MG PO TABS: ORAL_TABLET | ORAL | 0 refills | Status: AC

## 2023-11-22 NOTE — Progress Notes (Signed)
 [facility] [address] [place] [address] Dept: [phone] Dept Fax: [phone]  Acute Care Office Visit  Subjective:   Gary Becker [birth year] 11/22/2023  Chief Complaint  Patient presents with   Ear Pain    3 day stuffy ear, little pain    HPI:  Discussed the use of AI scribe software for clinical note transcription with the patient, who gave verbal consent to proceed.  History of Present Illness   Gary Becker is a 53 year old male who presents with right ear pain and muffled hearing.  He has been experiencing a dull pain in his right ear for the past two to three days, accompanied by a sensation of stuffiness and slightly muffled hearing. He has been using olive oil in the ear and laying on a heating pad, which provides temporary relief, but the symptoms persist.  He reports a slight fever, feeling warm intermittently, but not constantly. He also notes a little bit of nasal drip but denies significant nasal congestion or a runny nose. He mentions a sore neck. No persistent cough.   His family has been sick recently, with his daughter experiencing coughing and wheezing, and his wife having been sick for three weeks after he brought home an illness about three weeks ago. He himself was sick for about two days with what he describes as a common cold.   He currently takes hyoscyamine  for stomach cramps for IBS, approximately once a week and is requesting a refill.     The following portions of the patient's history were reviewed and updated as appropriate: past medical history, past surgical history, family history, social history, allergies, medications, and problem list.   Patient Active Problem List   Diagnosis Date Noted   Asymptomatic microscopic hematuria 04/11/2023   Frequency of micturition 04/04/2023   Chronic right shoulder pain 04/04/2023   Moderate mixed hyperlipidemia not requiring statin therapy  04/04/2023   Vitamin D  deficiency 04/04/2023   Wears contact lenses 04/04/2023   Erectile dysfunction 04/04/2023   Other hemorrhoids 09/06/2013   Rectal bleeding 09/06/2013   IBS (irritable bowel syndrome) 09/06/2013   Irritable bowel syndrome 04/27/2012   Past Medical History:  Diagnosis Date   ADD (attention deficit disorder)    Allergy    Hemorrhoids    IBS (irritable bowel syndrome)    Moderate mixed hyperlipidemia not requiring statin therapy 04/04/2023   Overactive bladder    Past Surgical History:  Procedure Laterality Date   KIDNEY STONE SURGERY     WISDOM TOOTH EXTRACTION     Family History  Problem Relation Age of Onset   ALS Mother    Depression Mother    Stroke Paternal Grandfather    Alzheimer's disease Paternal Grandfather    Heart disease Paternal Grandfather    ADD / ADHD Sister     Current Outpatient Medications:    azithromycin (ZITHROMAX) 250 MG tablet, Take 2 tablets on day 1, then 1 tablet daily on days 2 through 5, Disp: 6 tablet, Rfl: 0   MYRBETRIQ 50 MG TB24 tablet, Take 50 mg by mouth daily., Disp: , Rfl:    tadalafil (CIALIS) 5 MG tablet, Take 5 mg by mouth daily., Disp: , Rfl:    hyoscyamine  (ANASPAZ ) 0.125 MG TBDP disintergrating tablet, Place 1 tablet (0.125 mg total) under the tongue every 4 (four) hours as needed for cramping., Disp: 120 tablet, Rfl: 0 Allergies  Allergen Reactions   Amoxicillin Rash   Penicillins Rash  ROS: A complete ROS was performed with pertinent positives/negatives noted in the HPI. The remainder of the ROS are negative.    Objective:   Today's Vitals   11/22/23 1426  BP: 124/82  Pulse: 68  Temp: 98.7 F (37.1 C)  TempSrc: Temporal  SpO2: 98%  Weight: 152 lb 6.4 oz (69.1 kg)  Height: 5' 11 (1.803 m)    GENERAL: Well-appearing, in NAD. Well nourished.  SKIN: Pink, warm and dry. No rash.  HEENT:    HEAD: Normocephalic, non-traumatic.  EYES: Conjunctive pink without exudate.  EARS: External ear w/o  redness, swelling, masses, or lesions. EAC clear. TM's intact, erythematous bulging to right TM.  NOSE: Septum midline w/o deformity. Nares patent, mucosa pink and non-inflamed w/o drainage. No sinus tenderness.  THROAT: Uvula midline. Oropharynx clear. Tonsils non-inflamed w/o exudate. Mucus membranes pink and moist.  NECK: Trachea midline. Full ROM w/o pain or tenderness. No lymphadenopathy.  RESPIRATORY: Chest wall symmetrical. Respirations even and non-labored. Breath sounds clear to auscultation bilaterally.  CARDIAC: S1, S2 present, regular rate and rhythm. Peripheral pulses 2+ bilaterally.  EXTREMITIES: Without clubbing, cyanosis, or edema.  NEUROLOGIC: Steady, even gait.  PSYCH/MENTAL STATUS: Alert, oriented x 3. Cooperative, appropriate mood and affect.    No results found for any visits on 11/22/23.    Assessment & Plan:  Assessment and Plan    Acute right otitis media - Prescribed azithromycin (Z-Pak): 2 tablets on the first day, then 1 tablet daily for the next 4 days. - Recommended over-the-counter Zyrtec for sinus congestion and ear fluid. - Advised Sudafed for congestion. - Tylenol and ibuprofen as needed for pain  Mixed irritable bowel syndrome Managed with hyoscyamine  for stomach cramps, used approximately once a week. - Refilled hyoscyamine  prescription.      Meds ordered this encounter  Medications   azithromycin (ZITHROMAX) 250 MG tablet    Sig: Take 2 tablets on day 1, then 1 tablet daily on days 2 through 5    Dispense:  6 tablet    Refill:  0    Supervising Provider:   THOMPSON, AARON B [[id number]]   hyoscyamine  (ANASPAZ ) 0.125 MG TBDP disintergrating tablet    Sig: Place 1 tablet (0.125 mg total) under the tongue every 4 (four) hours as needed for cramping.    Dispense:  120 tablet    Refill:  0    Supervising Provider:   SEBASTIAN BEVERLEY NOVAK [[id number]]   No orders of the defined types were placed in this encounter.  Lab Orders  No laboratory test(s)  ordered today   No images are attached to the encounter or orders placed in the encounter.  Return if symptoms worsen or fail to improve.   Rosina Senters, FNP

## 2023-11-22 NOTE — Patient Instructions (Signed)
 Can take Zyrtec and Pseudoephedrine for sinus / nasal congestion   Tylenol and ibuprofen as needed for pain

## 2024-04-10 ENCOUNTER — Encounter: Admitting: Family Medicine
